# Patient Record
Sex: Female | Born: 1965 | Race: Asian | Hispanic: No | Marital: Married
Health system: Southern US, Community
[De-identification: ages and names within clinical notes are randomized; demographics above are authoritative.]

---

## 1998-12-02 ENCOUNTER — Other Ambulatory Visit: Admission: RE | Admit: 1998-12-02 | Discharge: 1998-12-02 | Payer: Self-pay | Admitting: Obstetrics and Gynecology

## 1999-12-29 ENCOUNTER — Other Ambulatory Visit: Admission: RE | Admit: 1999-12-29 | Discharge: 1999-12-29 | Payer: Self-pay | Admitting: Obstetrics and Gynecology

## 2000-08-08 ENCOUNTER — Other Ambulatory Visit: Admission: RE | Admit: 2000-08-08 | Discharge: 2000-08-08 | Payer: Self-pay | Admitting: Obstetrics and Gynecology

## 2000-10-03 ENCOUNTER — Ambulatory Visit (HOSPITAL_COMMUNITY): Admission: RE | Admit: 2000-10-03 | Discharge: 2000-10-03 | Payer: Self-pay | Admitting: Obstetrics and Gynecology

## 2000-10-03 ENCOUNTER — Encounter: Payer: Self-pay | Admitting: Obstetrics and Gynecology

## 2001-02-21 ENCOUNTER — Inpatient Hospital Stay (HOSPITAL_COMMUNITY): Admission: AD | Admit: 2001-02-21 | Discharge: 2001-02-23 | Payer: Self-pay | Admitting: Obstetrics and Gynecology

## 2004-09-04 ENCOUNTER — Other Ambulatory Visit: Admission: RE | Admit: 2004-09-04 | Discharge: 2004-09-04 | Payer: Self-pay | Admitting: Obstetrics and Gynecology

## 2004-10-09 ENCOUNTER — Ambulatory Visit (HOSPITAL_COMMUNITY): Admission: RE | Admit: 2004-10-09 | Discharge: 2004-10-09 | Payer: Self-pay | Admitting: Obstetrics and Gynecology

## 2004-10-09 ENCOUNTER — Encounter (INDEPENDENT_AMBULATORY_CARE_PROVIDER_SITE_OTHER): Payer: Self-pay | Admitting: Specialist

## 2008-02-29 ENCOUNTER — Encounter: Admission: RE | Admit: 2008-02-29 | Discharge: 2008-02-29 | Payer: Self-pay | Admitting: Obstetrics and Gynecology

## 2009-03-06 ENCOUNTER — Encounter: Admission: RE | Admit: 2009-03-06 | Discharge: 2009-03-06 | Payer: Self-pay | Admitting: Obstetrics and Gynecology

## 2010-04-10 ENCOUNTER — Encounter: Admission: RE | Admit: 2010-04-10 | Discharge: 2010-04-10 | Payer: Self-pay | Admitting: Obstetrics and Gynecology

## 2011-03-18 ENCOUNTER — Other Ambulatory Visit: Payer: Self-pay | Admitting: Obstetrics and Gynecology

## 2011-03-18 DIAGNOSIS — Z1231 Encounter for screening mammogram for malignant neoplasm of breast: Secondary | ICD-10-CM

## 2011-03-19 NOTE — Discharge Summary (Signed)
[facility]  Patient:    Patricia Dorsey, Patricia Dorsey                  MRN: [id number] Adm. Date:  [id number] Disc. Date: [id number] Attending:  Oliver Pila                           Discharge Summary  DISCHARGE DIAGNOSES:          1. Term pregnancy at 39 weeks, delivered.                               2. 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                               3. Large for gestational age.                               4. Moderate shoulder dystocia.                               5. Spontaneous vaginal delivery.  DISCHARGE MEDICATIONS:        1. Percocet one to two tablets p.o. every four                                  hours p.r.n.                               2. Motrin 600 mg p.o. every six hours p.r.n.  DISCHARGE FOLLOWUP:           The patient is to follow up in our office in approximately six weeks for her routine postpartum exam.  HOSPITAL COURSE:              The patient is a 45 year old G2, P1-0-0-1 who is admitted at 87 weeks for induction given large for gestational age fetus and a favorable cervix. Pregnancy had been otherwise complicated by a group strep positive status and previous pregnancy was gestational diabetes although the patient has had a normal glucola screen x 2 this pregnancy.  PRENATAL LABORATORY DATA:     A positive, antibody negative, RPR nonreactive, rubella immune, hepatitis B surface antigen negative, HIV negative. GC negative, Chlamydia negative. Alphafetoprotein normal and group B strep positive.  PAST OBSTETRIC HISTORY:       In 1998, she had a normal spontaneous vaginal delivery of an 8-pound 2-ounce infant.  PAST GYNECOLOGIC HISTORY:     None except for she had cervical polyp which had been noted in the first trimester.  PAST SURGICAL HISTORY:        None.  PAST MEDICAL HISTORY:         None.  FAMILY HISTORY:               None.  PHYSICAL EXAMINATION:         On admission, the patient was afebrile  with stable vital signs. Fetal heart was reactive. She was begun on IV Pitocin and had assisted rupture of membranes with clear fluid. Cervix at that time was 3 cm dilated, 70% effaced, and a -  1 station. She was also begun on her group strep prophylaxis. The patient reached complete dilation and pushed well with a normal spontaneous vaginal delivery of a vigorous female infant over a second degree laceration, Apgars were 9 and 9, weight was 9 pounds 13 ounces. Delivery was complicated by moderate shoulder dystocia which required McRoberts, woodscrew, and ultimately posterior arm release to affect delivery. Cervix and rectum were intact. Estimated blood loss was 400 cc and a the second-degree perineal laceration was repaired with 2-0 and 3-0 Vicryl. The placenta delivered spontaneously and the patient was admitted for routine postpartum care. She did very well and postpartum day #2, was afebrile with stable vital signs, her pain was well controlled, and she was voiding without difficulty, and lochia was normal. Therefore, she was considered stable for discharge home and was given prescriptions and followup as previously stated.  DD:  02/23/01 TD:  02/23/01 Job: [id number]

## 2011-03-19 NOTE — Op Note (Signed)
NAME:  Patricia Dorsey, Patricia Dorsey NO.:  [id number]   MEDICAL RECORD NO.:  [id number]          PATIENT TYPE:  AMB   LOCATION:  DAY                          FACILITY:  [facility]   PHYSICIAN:  Malachi Pro. Ambrose Mantle, M.D. DATE OF BIRTH:  09-04-1966   DATE OF PROCEDURE:  10/09/2004  DATE OF DISCHARGE:                                 OPERATIVE REPORT   PREOPERATIVE DIAGNOSIS:  History of abnormal uterine bleeding with biopsy-  proven endometrial polyp.   POSTOPERATIVE DIAGNOSIS:  History of abnormal uterine bleeding with biopsy-  proven endometrial polyp.   OPERATION:  Fractional dilatation and curettage, hysteroscopy, removal of  endometrial polyp.   SURGEON:  Malachi Pro. Ambrose Mantle, M.D.   ANESTHESIA:  General.   The patient was brought to the operating room and placed under satisfactory  general anesthesia and placed in the lithotomy position.  Exam revealed the  rectum to have stool in it.  The uterus was anterior and of normal size.  The adnexa were free of masses.  The vulva, vagina, and perineum were  prepped with Betadine solution and draped in a sterile field with a  collecting bag underneath the buttocks to collect the sorbitol.  The cervix  was exposed and drawn into the operative field.  It sounded to 9 cm,  slightly anteriorly, and the cervical canal was dilated to a 27 Pratt  dilator.  The hysteroscope was introduced.  Visualization was somewhat poor  initially.  I could identify some polypoid tissue in the endometrial cavity.  I could not determine exactly from where it arose.  I removed the  hysteroscope and used the polyp forceps after a D&C of the endocervical  canal produced minimal tissue.  I then used the polyp forceps to pull out  polypoid tissue.  I could tell by the feel of the tissue that it was  attached, and I got a couple of large pieces of polypoid tissue.  I then did  a D&C of the endometrial cavity until the walls felt gritty.  I then re-  looked with the  hysteroscope and was able to get a beautiful view, seeing  both tubal ostia, seeing the entire fundus of the uterus as well as the  remainder of the endometrial cavity and endocervical canal.  There were two  extra pieces of either endometrial tissue or polypoid tissue remaining that  I removed with the biopsy forceps through the hysteroscope.  The procedure  was then terminated.  There was some bleeding from the tenaculum site, and I  sutured it with 2-0 chromic.  The patient seemed to tolerate the procedure  well.  Blood loss was no more than 10 cc.  Sponge and needle counts were  correct.  The patient was returned to recovery in satisfactory condition.     Scharlene Corn   TFH/MEDQ  D:  10/09/2004  T:  10/09/2004  Job:  [id number]

## 2011-04-22 ENCOUNTER — Ambulatory Visit
Admission: RE | Admit: 2011-04-22 | Discharge: 2011-04-22 | Disposition: A | Discharge status: Home / Self Care | Payer: BC Managed Care – PPO | Source: Ambulatory Visit | Attending: Obstetrics and Gynecology | Admitting: Obstetrics and Gynecology

## 2011-04-22 DIAGNOSIS — Z1231 Encounter for screening mammogram for malignant neoplasm of breast: Secondary | ICD-10-CM

## 2012-03-20 ENCOUNTER — Other Ambulatory Visit: Payer: Self-pay | Admitting: Obstetrics and Gynecology

## 2012-03-20 DIAGNOSIS — Z1231 Encounter for screening mammogram for malignant neoplasm of breast: Secondary | ICD-10-CM

## 2012-04-27 ENCOUNTER — Ambulatory Visit: Payer: BC Managed Care – PPO

## 2012-05-01 ENCOUNTER — Ambulatory Visit
Admission: RE | Admit: 2012-05-01 | Discharge: 2012-05-01 | Disposition: A | Discharge status: Home / Self Care | Payer: Self-pay | Source: Ambulatory Visit | Attending: Obstetrics and Gynecology | Admitting: Obstetrics and Gynecology

## 2012-05-01 DIAGNOSIS — Z1231 Encounter for screening mammogram for malignant neoplasm of breast: Secondary | ICD-10-CM

## 2013-03-29 ENCOUNTER — Other Ambulatory Visit: Payer: Self-pay

## 2013-03-29 DIAGNOSIS — Z1231 Encounter for screening mammogram for malignant neoplasm of breast: Secondary | ICD-10-CM

## 2013-05-09 ENCOUNTER — Ambulatory Visit
Admission: RE | Admit: 2013-05-09 | Discharge: 2013-05-09 | Disposition: A | Discharge status: Home / Self Care | Payer: 59 | Source: Ambulatory Visit

## 2013-05-09 DIAGNOSIS — Z1231 Encounter for screening mammogram for malignant neoplasm of breast: Secondary | ICD-10-CM

## 2013-05-10 ENCOUNTER — Other Ambulatory Visit: Payer: Self-pay | Admitting: Obstetrics and Gynecology

## 2013-05-10 DIAGNOSIS — R928 Other abnormal and inconclusive findings on diagnostic imaging of breast: Secondary | ICD-10-CM

## 2013-05-23 ENCOUNTER — Other Ambulatory Visit: Payer: 59

## 2013-05-23 ENCOUNTER — Ambulatory Visit
Admission: RE | Admit: 2013-05-23 | Discharge: 2013-05-23 | Disposition: A | Discharge status: Home / Self Care | Payer: 59 | Source: Ambulatory Visit | Attending: Obstetrics and Gynecology | Admitting: Obstetrics and Gynecology

## 2013-05-23 DIAGNOSIS — R928 Other abnormal and inconclusive findings on diagnostic imaging of breast: Secondary | ICD-10-CM

## 2014-04-26 ENCOUNTER — Other Ambulatory Visit: Payer: Self-pay

## 2014-04-26 DIAGNOSIS — Z1231 Encounter for screening mammogram for malignant neoplasm of breast: Secondary | ICD-10-CM

## 2014-05-28 ENCOUNTER — Ambulatory Visit
Admission: RE | Admit: 2014-05-28 | Discharge: 2014-05-28 | Disposition: A | Discharge status: Home / Self Care | Payer: 59 | Source: Ambulatory Visit

## 2014-05-28 ENCOUNTER — Encounter (INDEPENDENT_AMBULATORY_CARE_PROVIDER_SITE_OTHER): Payer: Self-pay

## 2014-05-28 DIAGNOSIS — Z1231 Encounter for screening mammogram for malignant neoplasm of breast: Secondary | ICD-10-CM

## 2015-05-01 ENCOUNTER — Other Ambulatory Visit: Payer: Self-pay

## 2015-05-01 DIAGNOSIS — Z1231 Encounter for screening mammogram for malignant neoplasm of breast: Secondary | ICD-10-CM

## 2015-06-03 ENCOUNTER — Ambulatory Visit
Admission: RE | Admit: 2015-06-03 | Discharge: 2015-06-03 | Disposition: A | Discharge status: Home / Self Care | Payer: 59 | Source: Ambulatory Visit

## 2015-06-03 DIAGNOSIS — Z1231 Encounter for screening mammogram for malignant neoplasm of breast: Secondary | ICD-10-CM

## 2016-06-16 ENCOUNTER — Other Ambulatory Visit: Payer: Self-pay | Admitting: Obstetrics and Gynecology

## 2016-06-16 DIAGNOSIS — Z1231 Encounter for screening mammogram for malignant neoplasm of breast: Secondary | ICD-10-CM

## 2016-06-22 ENCOUNTER — Ambulatory Visit
Admission: RE | Admit: 2016-06-22 | Discharge: 2016-06-22 | Disposition: A | Discharge status: Home / Self Care | Payer: 59 | Source: Ambulatory Visit | Attending: Obstetrics and Gynecology | Admitting: Obstetrics and Gynecology

## 2016-06-22 DIAGNOSIS — Z1231 Encounter for screening mammogram for malignant neoplasm of breast: Secondary | ICD-10-CM

## 2017-06-08 ENCOUNTER — Other Ambulatory Visit: Payer: Self-pay | Admitting: Obstetrics and Gynecology

## 2017-06-08 DIAGNOSIS — Z1231 Encounter for screening mammogram for malignant neoplasm of breast: Secondary | ICD-10-CM

## 2017-06-24 ENCOUNTER — Ambulatory Visit
Admission: RE | Admit: 2017-06-24 | Discharge: 2017-06-24 | Disposition: A | Discharge status: Home / Self Care | Payer: 59 | Source: Ambulatory Visit | Attending: Obstetrics and Gynecology | Admitting: Obstetrics and Gynecology

## 2017-06-24 ENCOUNTER — Encounter (INDEPENDENT_AMBULATORY_CARE_PROVIDER_SITE_OTHER): Payer: Self-pay

## 2017-06-24 DIAGNOSIS — Z1231 Encounter for screening mammogram for malignant neoplasm of breast: Secondary | ICD-10-CM

## 2017-11-02 DIAGNOSIS — L700 Acne vulgaris: Secondary | ICD-10-CM | POA: Diagnosis not present

## 2017-11-02 DIAGNOSIS — Z79899 Other long term (current) drug therapy: Secondary | ICD-10-CM | POA: Diagnosis not present

## 2017-11-02 DIAGNOSIS — L853 Xerosis cutis: Secondary | ICD-10-CM | POA: Diagnosis not present

## 2017-11-18 DIAGNOSIS — Z1211 Encounter for screening for malignant neoplasm of colon: Secondary | ICD-10-CM | POA: Diagnosis not present

## 2017-11-30 DIAGNOSIS — Z79899 Other long term (current) drug therapy: Secondary | ICD-10-CM | POA: Diagnosis not present

## 2017-11-30 DIAGNOSIS — L700 Acne vulgaris: Secondary | ICD-10-CM | POA: Diagnosis not present

## 2017-12-06 DIAGNOSIS — K644 Residual hemorrhoidal skin tags: Secondary | ICD-10-CM | POA: Diagnosis not present

## 2017-12-07 DIAGNOSIS — L700 Acne vulgaris: Secondary | ICD-10-CM | POA: Diagnosis not present

## 2017-12-07 DIAGNOSIS — Z79899 Other long term (current) drug therapy: Secondary | ICD-10-CM | POA: Diagnosis not present

## 2017-12-28 DIAGNOSIS — L700 Acne vulgaris: Secondary | ICD-10-CM | POA: Diagnosis not present

## 2017-12-28 DIAGNOSIS — Z79899 Other long term (current) drug therapy: Secondary | ICD-10-CM | POA: Diagnosis not present

## 2018-01-30 DIAGNOSIS — L700 Acne vulgaris: Secondary | ICD-10-CM | POA: Diagnosis not present

## 2018-01-30 DIAGNOSIS — Z79899 Other long term (current) drug therapy: Secondary | ICD-10-CM | POA: Diagnosis not present

## 2018-05-24 ENCOUNTER — Other Ambulatory Visit: Payer: Self-pay | Admitting: Obstetrics and Gynecology

## 2018-05-24 DIAGNOSIS — Z1231 Encounter for screening mammogram for malignant neoplasm of breast: Secondary | ICD-10-CM

## 2018-06-26 DIAGNOSIS — Z8249 Family history of ischemic heart disease and other diseases of the circulatory system: Secondary | ICD-10-CM | POA: Diagnosis not present

## 2018-06-26 DIAGNOSIS — Z13220 Encounter for screening for lipoid disorders: Secondary | ICD-10-CM | POA: Diagnosis not present

## 2018-06-26 DIAGNOSIS — Z0000 Encounter for general adult medical examination without abnormal findings: Secondary | ICD-10-CM | POA: Diagnosis not present

## 2018-06-26 DIAGNOSIS — Z79899 Other long term (current) drug therapy: Secondary | ICD-10-CM | POA: Diagnosis not present

## 2018-06-29 ENCOUNTER — Ambulatory Visit
Admission: RE | Admit: 2018-06-29 | Discharge: 2018-06-29 | Disposition: A | Discharge status: Home / Self Care | Payer: 59 | Source: Ambulatory Visit | Attending: Obstetrics and Gynecology | Admitting: Obstetrics and Gynecology

## 2018-06-29 DIAGNOSIS — Z1231 Encounter for screening mammogram for malignant neoplasm of breast: Secondary | ICD-10-CM

## 2018-07-28 DIAGNOSIS — E7800 Pure hypercholesterolemia, unspecified: Secondary | ICD-10-CM | POA: Diagnosis not present

## 2018-08-04 DIAGNOSIS — Z124 Encounter for screening for malignant neoplasm of cervix: Secondary | ICD-10-CM | POA: Diagnosis not present

## 2018-08-04 DIAGNOSIS — Z130 Encounter for screening for diseases of the blood and blood-forming organs and certain disorders involving the immune mechanism: Secondary | ICD-10-CM | POA: Diagnosis not present

## 2018-08-04 DIAGNOSIS — Z01419 Encounter for gynecological examination (general) (routine) without abnormal findings: Secondary | ICD-10-CM | POA: Diagnosis not present

## 2018-08-04 DIAGNOSIS — Z1389 Encounter for screening for other disorder: Secondary | ICD-10-CM | POA: Diagnosis not present

## 2018-08-09 DIAGNOSIS — Z23 Encounter for immunization: Secondary | ICD-10-CM | POA: Diagnosis not present

## 2018-12-07 DIAGNOSIS — E7800 Pure hypercholesterolemia, unspecified: Secondary | ICD-10-CM | POA: Diagnosis not present

## 2019-01-21 IMAGING — MG DIGITAL SCREENING BILATERAL MAMMOGRAM WITH TOMO AND CAD
8 series · 9 of 24 positions shown · non-contrast
Comparison: Previous exam(s).

CLINICAL DATA: Screening.

EXAM:
DIGITAL SCREENING BILATERAL MAMMOGRAM WITH TOMO AND CAD

[R CC synth-2D]
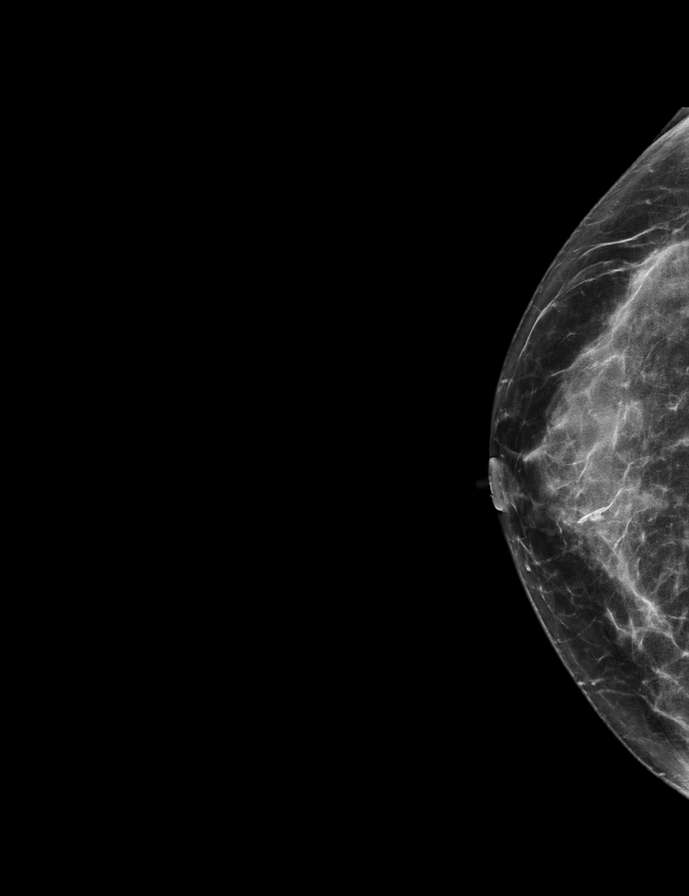

[L CC synth-2D]
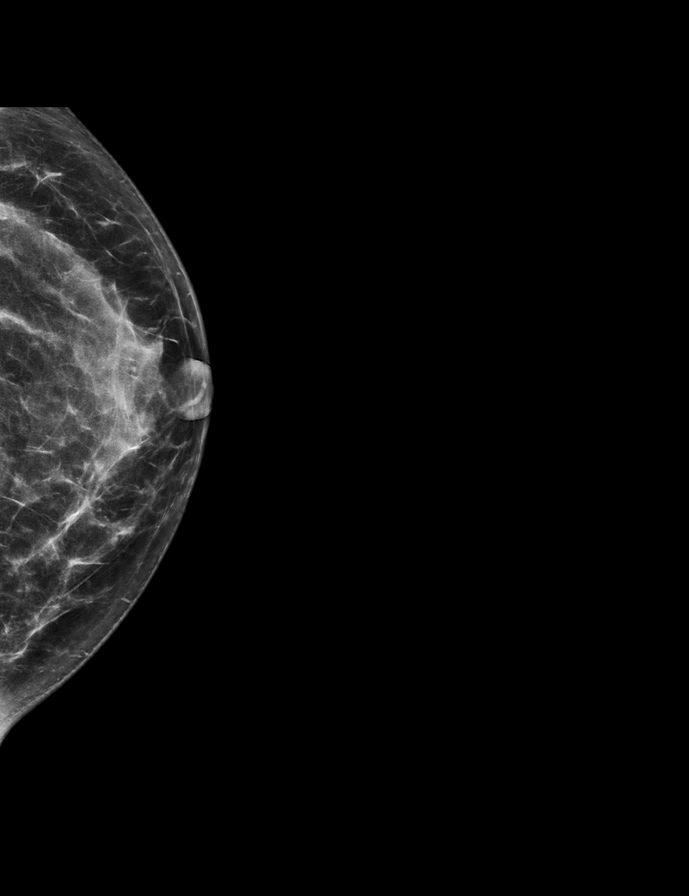

[L MLO synth-2D]
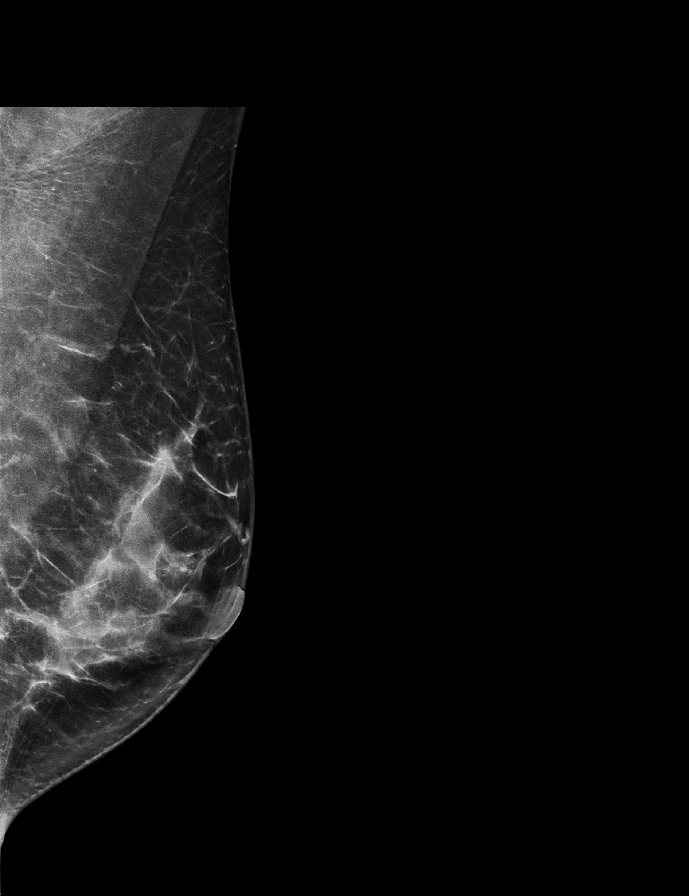

[R MLO synth-2D]
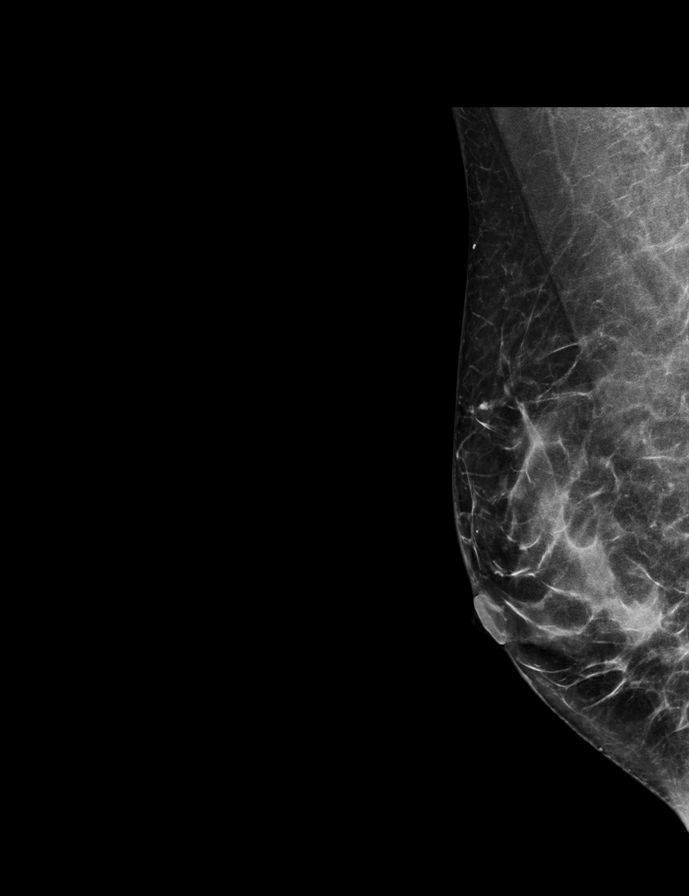

[R CC tomo · 2 of 64 frames shown]
[frame 21/64]
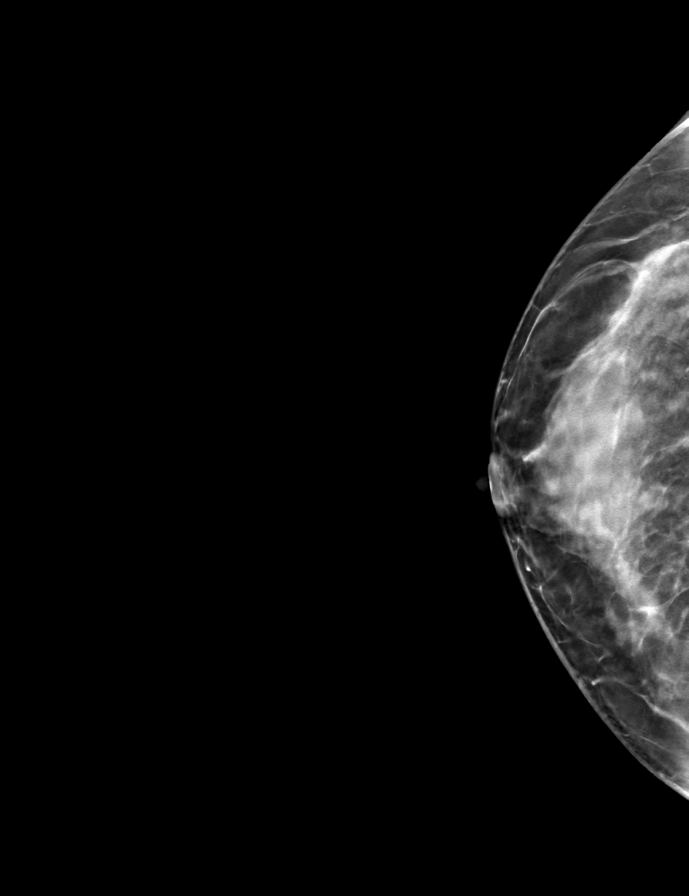
[frame 33/64]
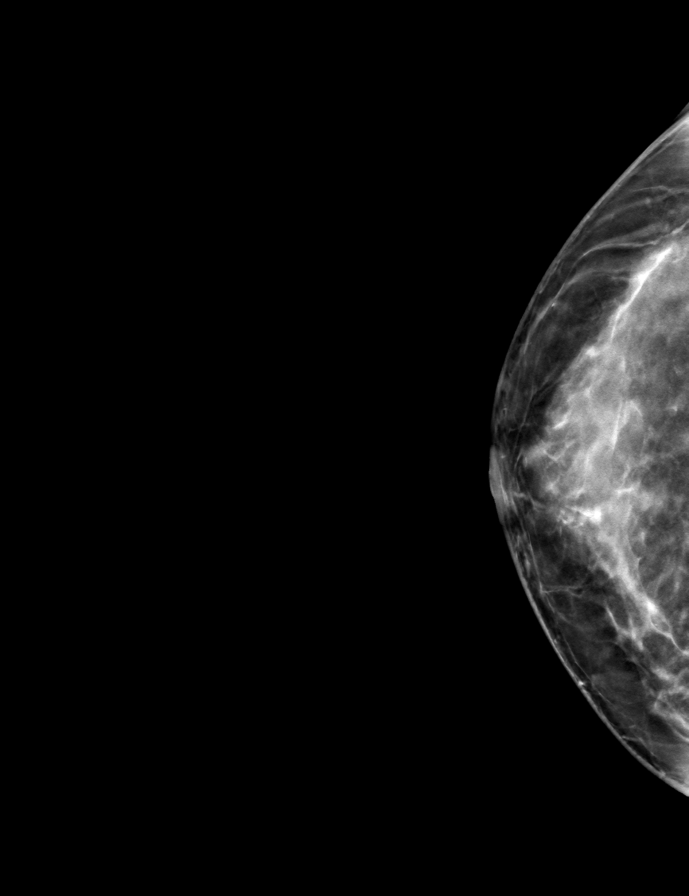

[L CC tomo · tomo slice 31/62.0]
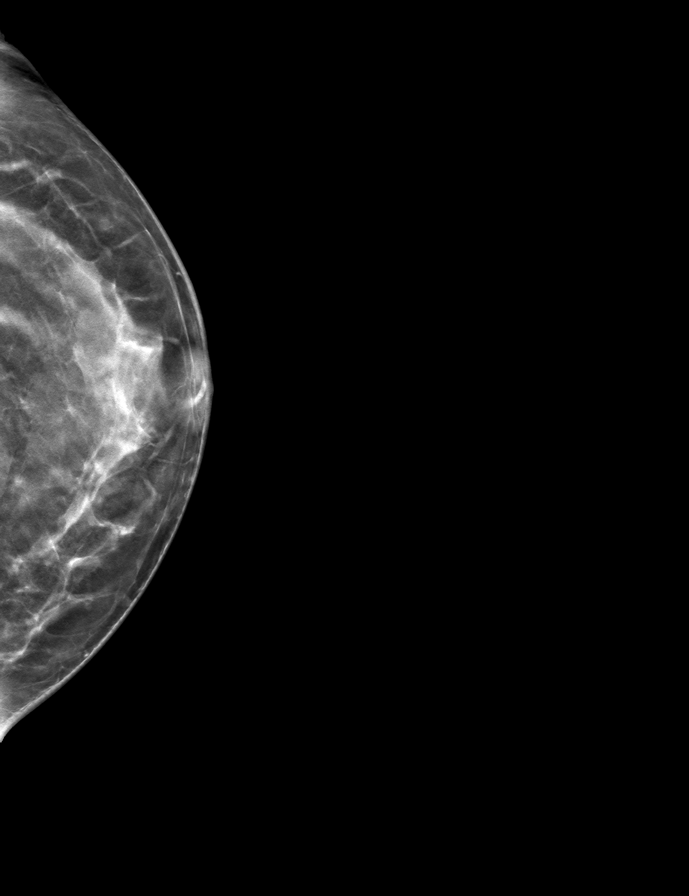

[R MLO tomo · tomo slice 33/64.0]
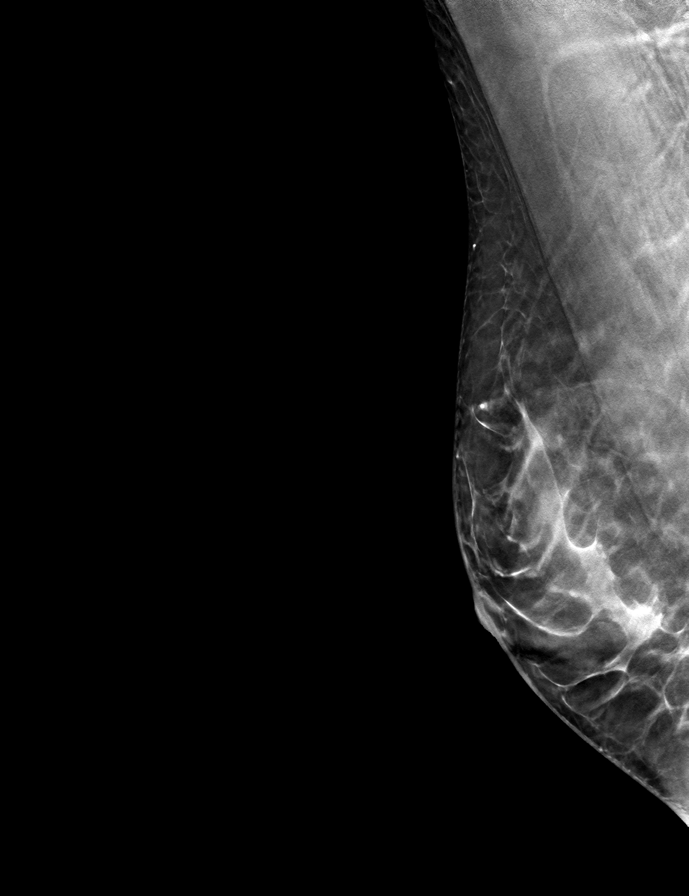

[L MLO tomo · tomo slice 35/68.0]
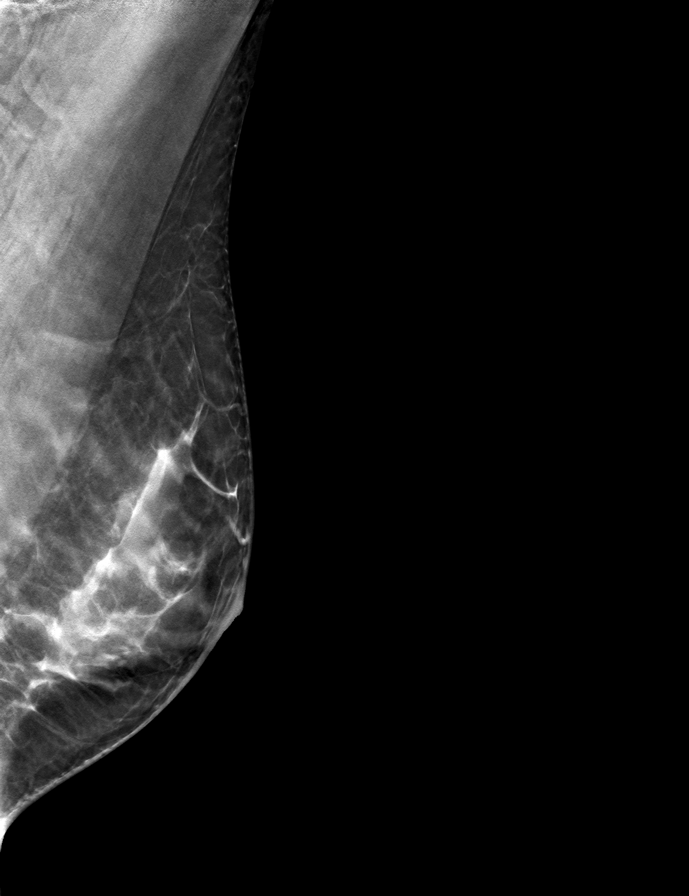

[9 of 24 positions shown; findings below may reference images not displayed]

ACR Breast Density Category c: The breast tissue is heterogeneously
dense, which may obscure small masses.
FINDINGS: There are no findings suspicious for malignancy. Images were
processed with CAD.
IMPRESSION: No mammographic evidence of malignancy. A result letter of this
screening mammogram will be mailed directly to the patient.

RECOMMENDATION:
Screening mammogram in one year. (Code:FT-U-LHB)

BI-RADS CATEGORY  1: Negative.

## 2019-02-06 DIAGNOSIS — E7800 Pure hypercholesterolemia, unspecified: Secondary | ICD-10-CM | POA: Diagnosis not present

## 2019-02-06 DIAGNOSIS — Z79899 Other long term (current) drug therapy: Secondary | ICD-10-CM | POA: Diagnosis not present

## 2019-06-01 ENCOUNTER — Other Ambulatory Visit: Payer: Self-pay | Admitting: Obstetrics and Gynecology

## 2019-06-01 DIAGNOSIS — Z1231 Encounter for screening mammogram for malignant neoplasm of breast: Secondary | ICD-10-CM

## 2019-06-25 ENCOUNTER — Other Ambulatory Visit: Payer: Self-pay

## 2019-06-25 DIAGNOSIS — Z20822 Contact with and (suspected) exposure to covid-19: Secondary | ICD-10-CM

## 2019-06-26 ENCOUNTER — Telehealth: Payer: Self-pay | Admitting: *Deleted

## 2019-06-26 LAB — NOVEL CORONAVIRUS, NAA: SARS-CoV-2, NAA: NOT DETECTED

## 2019-06-26 NOTE — Telephone Encounter (Signed)
Patient returned call to obtain COVID 19 test results from test done 06/25/2019.  Patient notified of negative test results.

## 2019-07-17 ENCOUNTER — Other Ambulatory Visit: Payer: Self-pay

## 2019-07-17 ENCOUNTER — Ambulatory Visit
Admission: RE | Admit: 2019-07-17 | Discharge: 2019-07-17 | Disposition: A | Discharge status: Home / Self Care | Payer: 59 | Source: Ambulatory Visit | Attending: Obstetrics and Gynecology | Admitting: Obstetrics and Gynecology

## 2019-07-17 DIAGNOSIS — Z1231 Encounter for screening mammogram for malignant neoplasm of breast: Secondary | ICD-10-CM

## 2019-08-08 ENCOUNTER — Other Ambulatory Visit: Payer: Self-pay

## 2019-08-08 DIAGNOSIS — Z20822 Contact with and (suspected) exposure to covid-19: Secondary | ICD-10-CM

## 2019-08-10 LAB — NOVEL CORONAVIRUS, NAA: SARS-CoV-2, NAA: NOT DETECTED

## 2020-06-17 ENCOUNTER — Other Ambulatory Visit: Payer: Self-pay | Admitting: Obstetrics and Gynecology

## 2020-06-17 DIAGNOSIS — Z1231 Encounter for screening mammogram for malignant neoplasm of breast: Secondary | ICD-10-CM

## 2020-07-22 ENCOUNTER — Ambulatory Visit
Admission: RE | Admit: 2020-07-22 | Discharge: 2020-07-22 | Disposition: A | Discharge status: Home / Self Care | Payer: 59 | Source: Ambulatory Visit | Attending: Obstetrics and Gynecology | Admitting: Obstetrics and Gynecology

## 2020-07-22 ENCOUNTER — Other Ambulatory Visit: Payer: Self-pay

## 2020-07-22 DIAGNOSIS — Z1231 Encounter for screening mammogram for malignant neoplasm of breast: Secondary | ICD-10-CM
